# Patient Record
Sex: Male | Born: 2000 | Race: White | Hispanic: No | Marital: Single | Smoking: Current every day smoker
Health system: Southern US, Community
[De-identification: ages and names within clinical notes are randomized; demographics above are authoritative.]

## PROBLEM LIST (undated history)

## (undated) DIAGNOSIS — T7840XA Allergy, unspecified, initial encounter: Secondary | ICD-10-CM

## (undated) HISTORY — DX: Allergy, unspecified, initial encounter: T78.40XA

---
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
## 2019-03-02 ENCOUNTER — Ambulatory Visit (INDEPENDENT_AMBULATORY_CARE_PROVIDER_SITE_OTHER): Payer: BC Managed Care – PPO | Admitting: Family Medicine

## 2019-03-02 ENCOUNTER — Encounter: Payer: Self-pay | Admitting: Family Medicine

## 2019-03-02 ENCOUNTER — Other Ambulatory Visit: Payer: Self-pay

## 2019-03-02 VITALS — BP 118/80 | Temp 98.2°F | Wt 133.2 lb

## 2019-03-02 DIAGNOSIS — F439 Reaction to severe stress, unspecified: Secondary | ICD-10-CM

## 2019-03-02 DIAGNOSIS — F419 Anxiety disorder, unspecified: Secondary | ICD-10-CM

## 2019-03-02 NOTE — Progress Notes (Signed)
   Subjective:    Patient ID: Warren Short, male    DOB: July 20, 2000, 18 y.o.   MRN: [id number]  HPI Pt states he is having some issues with appetite. Pt states that sometimes he doesn't eat as much as he would like. Pt would like to eat more. Pt also has questions regarding waking up later and how that affects eating. This has been going on about a year or so. Pt did go through depression and lost a lot of weight. Pt also states that mind will sometimes race. Pt has tried counseling in the past.   Patient denies using any type of pills or other issues.  He states he is not depressed.  He does get stressed at times and occasionally angry.  He is trying to do the best he can to manage that.  He is looking for a job currently.  He did graduate high school.  He hopes to go to college next year.  He is also making music.  Working hard things.  Trying to have a healthy outlook.  He does go to bed very late at night and gets up late in the morning.  He states he does eat 3 meals a day but often eats out and does not fix meals for himself.  Patient denies any type of heartburn issues denies any health issues. Review of Systems  Constitutional: Negative for activity change.  HENT: Negative for congestion and rhinorrhea.   Respiratory: Negative for cough and shortness of breath.   Cardiovascular: Negative for chest pain.  Gastrointestinal: Negative for abdominal pain, diarrhea, nausea and vomiting.  Genitourinary: Negative for dysuria and hematuria.  Neurological: Negative for weakness and headaches.  Psychiatric/Behavioral: Negative for behavioral problems and confusion.       Objective:   Physical Exam  20 minutes spent with patient discussing multiple different issues greater than half was spent in counseling and discussion      Assessment & Plan:  Anxiety and stress-I believe the patient is adjusting.  Denies being suicidal.  States doing the best he can try to manage his stress.  Is trying to  eat somewhat on a regular basis but not eating healthy.  We did discuss ways to try to improve vegetables and fruits.  We also discussed ways to dissipate stress including meditation as well as mindfulness.  I do not feel the patient needs to be on any type of medications.  We did discuss about warning signs regarding depression and if they occur to follow-up.  At this time we have chosen to have the patient follow-up if having any ongoing troubles.  I also told him that he could always call us and I will be happy to call them back and talk with him.  No depression currently.  Patient did not want to do any type of counseling.

## 2019-07-26 ENCOUNTER — Other Ambulatory Visit: Payer: Self-pay | Admitting: *Deleted

## 2019-07-26 DIAGNOSIS — F419 Anxiety disorder, unspecified: Secondary | ICD-10-CM

## 2019-07-31 ENCOUNTER — Encounter: Payer: Self-pay | Admitting: Family Medicine

## 2019-08-17 DIAGNOSIS — Z23 Encounter for immunization: Secondary | ICD-10-CM | POA: Diagnosis not present

## 2019-09-08 DIAGNOSIS — Z23 Encounter for immunization: Secondary | ICD-10-CM | POA: Diagnosis not present

## 2020-02-14 ENCOUNTER — Ambulatory Visit (INDEPENDENT_AMBULATORY_CARE_PROVIDER_SITE_OTHER): Payer: BC Managed Care – PPO | Admitting: Family Medicine

## 2020-02-14 ENCOUNTER — Other Ambulatory Visit: Payer: Self-pay

## 2020-02-14 DIAGNOSIS — J019 Acute sinusitis, unspecified: Secondary | ICD-10-CM | POA: Diagnosis not present

## 2020-02-14 MED ORDER — AZITHROMYCIN 250 MG PO TABS: ORAL_TABLET | ORAL | 0 refills | Status: DC

## 2020-02-14 NOTE — Progress Notes (Signed)
   Marti Sleigh ctive:    Patient ID: Warren Short, male    DOB: May 15, 2000, 19 y.o.   MRN: [id number]  HPIsinus pressure yesterday and today has fever of 100.3. has not had a covid test. Has had both covid vaccines. Tried advil cold and flu.  Patient relates some low-grade fever body aches headache not feeling good sinus pressure pain discomfort.  PMH benign   Review of Systems See above    Objective:   Physical Exam  Mild sinus tenderness eardrums normal throat normal neck no masses lungs clear heart regular      Assessment & Plan:  Covid test taken Supportive care discussed Follow-up if progressive troubles or problems Warning signs discussed

## 2020-02-16 LAB — NOVEL CORONAVIRUS, NAA: SARS-CoV-2, NAA: DETECTED — AB

## 2020-02-16 LAB — SARS-COV-2, NAA 2 DAY TAT

## 2020-02-16 LAB — SPECIMEN STATUS REPORT

## 2020-02-17 ENCOUNTER — Telehealth (HOSPITAL_COMMUNITY): Payer: Self-pay | Admitting: Nurse Practitioner

## 2020-02-17 DIAGNOSIS — U071 COVID-19: Secondary | ICD-10-CM

## 2020-02-17 NOTE — Telephone Encounter (Signed)
Called to discuss with Warren Short about Covid symptoms and the use of regeneron, a monoclonal antibody infusion for those with mild to moderate Covid symptoms and at a high risk of hospitalization.     Pt is qualified for this infusion at the [facility] center due to co-morbid conditions and/or a member of an at-risk group, however declines infusion at this time. Symptoms have improved significantly and nearly resolved. Symptoms tier reviewed as well as criteria for ending isolation.  Symptoms reviewed that would warrant ED/Hospital evaluation. Preventative practices reviewed. Patient verbalized understanding. Patient advised to call back if he/she opts to proceed with infusion. Callback number provided. Urgent care and/or ER precautions given for severe symptoms. Last date eligible for infusion: 02/20/20   SVI based algorithm There are no problems to display for this patient.    Consuello Masse, NP [phone] [email or web address]

## 2020-02-27 ENCOUNTER — Telehealth: Payer: Self-pay

## 2020-02-27 NOTE — Telephone Encounter (Signed)
May do this as a office visit in person

## 2020-02-27 NOTE — Telephone Encounter (Signed)
Patient has appointment 10/21 for abdomen pain that been going on for 6 months but has had Covid on 10/6 and was up on 10/14 but mom and son live in same household and she is not off quarantine yet. Please advise on what to do with patient virtual or office for 10/21 at 11:30

## 2020-02-29 ENCOUNTER — Ambulatory Visit (INDEPENDENT_AMBULATORY_CARE_PROVIDER_SITE_OTHER): Payer: BC Managed Care – PPO | Admitting: Family Medicine

## 2020-02-29 ENCOUNTER — Other Ambulatory Visit: Payer: Self-pay

## 2020-02-29 ENCOUNTER — Encounter: Payer: Self-pay | Admitting: Family Medicine

## 2020-02-29 VITALS — HR 95 | Temp 97.5°F | Ht 73.0 in | Wt 135.0 lb

## 2020-02-29 DIAGNOSIS — R1030 Lower abdominal pain, unspecified: Secondary | ICD-10-CM

## 2020-02-29 DIAGNOSIS — S76211A Strain of adductor muscle, fascia and tendon of right thigh, initial encounter: Secondary | ICD-10-CM

## 2020-02-29 MED ORDER — MELOXICAM 15 MG PO TABS: 15.0000 mg | ORAL_TABLET | Freq: Every day | ORAL | 0 refills | Status: DC

## 2020-02-29 NOTE — Progress Notes (Signed)
   Subjective:    Patient ID: Warren Short, male    DOB: 2000/10/20, 19 y.o.   MRN: [id number]  HPIabdominal pain for a few months on right lower side. Pain is like an ache. Massage makes it better. Pain last about 5 mins.  Groin pain and discomfort on the right side.  Does not radiate into the testicles.  No dysuria urinary frequency no high fever chills or sweats Had previous hernia hernia on left side surgery at age 50  Review of Systems See above    Objective:   Physical Exam  Abdomen soft no guarding rebound or tenderness GU normal testicle exam normal bilateral no hernia detected bilateral.  No sign of any testicular problem    Assessment & Plan:  Ligament strain Has good range of motion of the hips Good strength Recommend anti-inflammatory over the next 2 weeks if ongoing trouble physical therapy if still ongoing trouble may need referral to orthopedics Follow-up here if ongoing troubles or problems no need for any x-rays lab work currently

## 2020-03-15 ENCOUNTER — Other Ambulatory Visit: Payer: Self-pay | Admitting: *Deleted

## 2020-03-15 ENCOUNTER — Telehealth: Payer: Self-pay | Admitting: Family Medicine

## 2020-03-15 MED ORDER — DOXYCYCLINE HYCLATE 100 MG PO TABS: 100.0000 mg | ORAL_TABLET | Freq: Two times a day (BID) | ORAL | 0 refills | Status: DC

## 2020-03-15 NOTE — Telephone Encounter (Signed)
Nurses I am fine with going with doxycycline 100 mg 1 pill taken twice daily with a snack and a tall glass of water for 10 days Follow-up if ongoing troubles  (Also to [place] is 19 years old.  Please make sure that his mom is on the DPR as his representative-if not you will need to speak with the patient himself thank you)

## 2020-03-15 NOTE — Telephone Encounter (Signed)
Discussed with pt. And med sent to pharm.  

## 2020-03-15 NOTE — Telephone Encounter (Signed)
Left message to return call 

## 2020-03-15 NOTE — Telephone Encounter (Signed)
Mom sent My Chart message:  Dr Lorin Picket  When [place] came in and was tested for covid you also gave him a zpack for his sinuses. It had cleared up but is coming back now. No fever but he is really stuffy and when he blows his nose its yellow and green. He also can feel it in his teeth and face. Does he need to come in again or do you think he needs another zpack?  Thank you   Please advise. Thank you

## 2020-03-26 ENCOUNTER — Ambulatory Visit: Payer: BC Managed Care – PPO | Admitting: Family Medicine

## 2020-04-15 ENCOUNTER — Telehealth: Payer: Self-pay | Admitting: *Deleted

## 2020-04-15 NOTE — Telephone Encounter (Signed)
Copied message below that front staff sent back in mother's chart on pt.   Mom refused to see anyone but Dr. Lorin Picket, said she thinks it's just his anxiety and not true stomach issues. York Spaniel he gets nauseated when he eats and this has been going on for a while now. She preferred to schedule a virtual appt for tomorrow.  Appt scheduled.

## 2020-04-16 ENCOUNTER — Other Ambulatory Visit: Payer: Self-pay

## 2020-04-16 ENCOUNTER — Ambulatory Visit (INDEPENDENT_AMBULATORY_CARE_PROVIDER_SITE_OTHER): Payer: BC Managed Care – PPO | Admitting: Family Medicine

## 2020-04-16 ENCOUNTER — Telehealth: Payer: Self-pay | Admitting: *Deleted

## 2020-04-16 DIAGNOSIS — F411 Generalized anxiety disorder: Secondary | ICD-10-CM | POA: Diagnosis not present

## 2020-04-16 DIAGNOSIS — R1013 Epigastric pain: Secondary | ICD-10-CM | POA: Diagnosis not present

## 2020-04-16 DIAGNOSIS — R110 Nausea: Secondary | ICD-10-CM

## 2020-04-16 MED ORDER — FAMOTIDINE 40 MG PO TABS: 40.0000 mg | ORAL_TABLET | Freq: Every day | ORAL | 2 refills | Status: DC

## 2020-04-16 MED ORDER — ONDANSETRON HCL 8 MG PO TABS: 8.0000 mg | ORAL_TABLET | Freq: Three times a day (TID) | ORAL | 2 refills | Status: DC | PRN

## 2020-04-16 NOTE — Telephone Encounter (Signed)
[name], [place] are scheduled for a virtual visit with your provider today.    Just as we do with appointments in the office, we must obtain your consent to participate.  Your consent will be active for this visit and any virtual visit you may have with one of our providers in the next 365 days.    If you have a MyChart account, I can also send a copy of this consent to you electronically.  All virtual visits are billed to your insurance company just like a traditional visit in the office.  As this is a virtual visit, video technology does not allow for your provider to perform a traditional examination.  This may limit your provider's ability to fully assess your condition.  If your provider identifies any concerns that need to be evaluated in person or the need to arrange testing such as labs, EKG, etc, we will make arrangements to do so.    Although advances in technology are sophisticated, we cannot ensure that it will always work on either your end or our end.  If the connection with a video visit is poor, we may have to switch to a telephone visit.  With either a video or telephone visit, we are not always able to ensure that we have a secure connection.   I need to obtain your verbal consent now.   Are you willing to proceed with your visit today?   [name] has provided verbal consent on 04/16/2020 for a virtual visit (video or telephone).   Kathleen Lime, RN 04/16/2020  2:48 PM

## 2020-04-16 NOTE — Progress Notes (Signed)
   Subjective:    Patient ID: Warren Short, male    DOB: 2000-12-27, 19 y.o.   MRN: [id number]  HPI Mild intermittent anxiousness and stress about various things but denies being depressed not interested in seeing a counselor currently.  Intermittently his stomach hurts when he gets stressed out He does admit to smoking marijuana denies abusing it occasionally gets nausea Patient calls to discuss anxiety and stress that causes his stomach to hurt after eating sometimes.   Office Visit from 02/12/2017 in [facility]  PHQ-9 Total Score 4     GAD 7 : Generalized Anxiety Score 04/16/2020 03/02/2019  Nervous, Anxious, on Edge 1 1  Control/stop worrying 1 0  Worry too much - different things 1 1  Trouble relaxing 0 0  Restless 0 0  Easily annoyed or irritable 1 3  Afraid - awful might happen 0 1  Total GAD 7 Score 4 6  Anxiety Difficulty Not difficult at all Somewhat difficult      Virtual Visit via Video Note  I connected with Warren Short on 04/16/20 at  3:30 PM EST by a video enabled telemedicine application and verified that I am speaking with the correct person using two identifiers.  Location: Patient: home Provider: office   I discussed the limitations of evaluation and management by telemedicine and the availability of in person appointments. The patient expressed understanding and agreed to proceed.  History of Present Illness:    Observations/Objective:   Assessment and Plan:   Follow Up Instructions:    I discussed the assessment and treatment plan with the patient. The patient was provided an opportunity to ask questions and all were answered. The patient agreed with the plan and demonstrated an understanding of the instructions.   The patient was advised to call back or seek an in-person evaluation if the symptoms worsen or if the condition fails to improve as anticipated.  I provided 25 including chart review documentation minutes of  non-face-to-face time during this encounter.      Review of Systems    Some intermittent epigastric nausea dyspepsia but no severe pain no emesis of blood no blood in stools Objective:   Physical Exam Today's visit was via telephone Physical exam was not possible for this visit        Assessment & Plan:  1. GAD (generalized anxiety disorder) I would recommend at this point time patient do stress relief techniques to help with this I recommended counseling if he is interested he will think about this Patient does smoke marijuana occasionally I encouraged him to avoid excessive use because this could cause hyperemesis  2. Dyspepsia Pepcid on a daily basis should help  3. Nausea Zofran as needed If ongoing troubles let us know

## 2020-10-28 ENCOUNTER — Other Ambulatory Visit: Payer: Self-pay

## 2020-10-28 ENCOUNTER — Ambulatory Visit: Payer: BC Managed Care – PPO | Admitting: Family Medicine

## 2020-10-28 ENCOUNTER — Encounter: Payer: Self-pay | Admitting: Family Medicine

## 2020-10-28 VITALS — BP 138/72 | HR 81 | Temp 98.7°F | Ht 73.0 in | Wt 140.0 lb

## 2020-10-28 DIAGNOSIS — R300 Dysuria: Secondary | ICD-10-CM | POA: Diagnosis not present

## 2020-10-28 DIAGNOSIS — N342 Other urethritis: Secondary | ICD-10-CM

## 2020-10-28 LAB — POCT URINALYSIS DIPSTICK
Spec Grav, UA: 1.01 (ref 1.010–1.025)
pH, UA: 8 (ref 5.0–8.0)

## 2020-10-28 MED ORDER — AZITHROMYCIN 250 MG PO TABS: ORAL_TABLET | ORAL | 0 refills | Status: DC

## 2020-10-28 NOTE — Progress Notes (Signed)
   Subjective:    Patient ID: Warren Short, male    DOB: 22-Aug-2000, 20 y.o.   MRN: [id number]  HPIuncomfortable feeling when having to urinate for past 8 days.  Relates slight stinging at the end of his penis is sexually active denies any discharge  Results for orders placed or performed in visit on 10/28/20  POCT urinalysis dipstick  Result Value Ref Range   Color, UA     Clarity, UA     Glucose, UA     Bilirubin, UA     Ketones, UA trace    Spec Grav, UA 1.010 1.010 - 1.025   Blood, UA     pH, UA 8.0 5.0 - 8.0   Protein, UA     Urobilinogen, UA     Nitrite, UA     Leukocytes, UA     Appearance     Odor         Review of Systems denies hematuria denies high fever chills sweats vomiting     Objective:   Physical Exam  Lungs clear heart regular pulse normal abdomen soft GU normal      Assessment & Plan:  Urethritis Check urine culture check GC chlamydia Azithromycin 250 mg tablet take 4 tablets all at 1 time follow-up if ongoing troubles warning signs discussed

## 2020-10-30 LAB — GC/CHLAMYDIA PROBE AMP
Chlamydia trachomatis, NAA: POSITIVE — AB
Neisseria Gonorrhoeae by PCR: NEGATIVE

## 2020-10-31 LAB — URINE CULTURE: Organism ID, Bacteria: NO GROWTH

## 2021-02-25 DIAGNOSIS — J101 Influenza due to other identified influenza virus with other respiratory manifestations: Secondary | ICD-10-CM | POA: Diagnosis not present

## 2021-07-30 ENCOUNTER — Ambulatory Visit: Payer: BC Managed Care – PPO | Admitting: Family Medicine

## 2021-07-30 ENCOUNTER — Other Ambulatory Visit: Payer: Self-pay

## 2021-07-30 VITALS — BP 128/80 | HR 80 | Temp 98.2°F | Ht 73.0 in | Wt 140.0 lb

## 2021-07-30 DIAGNOSIS — N63 Unspecified lump in unspecified breast: Secondary | ICD-10-CM

## 2021-07-30 NOTE — Progress Notes (Signed)
? ?  Subjective:  ? ? Patient ID: Warren Short, male    DOB: April 09, 2001, 21 y.o.   MRN: [id number] ? ?HPI ?Lump on Left side chest under left nipple x 1  month , soreness   ? ?Nodule left breast ?Denies any chest tightness pressure pain does relate a little bit of soreness ?Review of Systems ? ?   ?Objective:  ? Physical Exam ?Lungs clear heart regular ?Nodule noted underneath the left nipple ?Right nipple no trouble ? ? ? ? ?   ?Assessment & Plan:  ?Breast nodule ?Mammogram recommended ?Lab work ordered ?May need consultation with breast surgeon depending on the results of the test ?More than likely benign ?Still needs thorough follow-through patient understands ? ?

## 2021-08-05 ENCOUNTER — Other Ambulatory Visit: Payer: Self-pay

## 2021-08-05 ENCOUNTER — Ambulatory Visit (HOSPITAL_COMMUNITY)
Admission: RE | Admit: 2021-08-05 | Discharge: 2021-08-05 | Disposition: A | Discharge status: Home / Self Care | Payer: BC Managed Care – PPO | Source: Ambulatory Visit | Attending: Family Medicine | Admitting: Family Medicine

## 2021-08-05 DIAGNOSIS — N63 Unspecified lump in unspecified breast: Secondary | ICD-10-CM | POA: Diagnosis not present

## 2021-08-05 DIAGNOSIS — N644 Mastodynia: Secondary | ICD-10-CM | POA: Diagnosis not present

## 2021-08-05 DIAGNOSIS — N62 Hypertrophy of breast: Secondary | ICD-10-CM | POA: Diagnosis not present

## 2021-08-28 ENCOUNTER — Other Ambulatory Visit: Payer: Self-pay | Admitting: Family Medicine

## 2021-08-28 DIAGNOSIS — N63 Unspecified lump in unspecified breast: Secondary | ICD-10-CM

## 2021-09-16 ENCOUNTER — Ambulatory Visit: Payer: BC Managed Care – PPO | Admitting: General Surgery

## 2021-09-30 ENCOUNTER — Ambulatory Visit: Payer: BC Managed Care – PPO | Admitting: General Surgery

## 2021-10-09 ENCOUNTER — Encounter: Payer: Self-pay | Admitting: General Surgery

## 2021-10-09 ENCOUNTER — Ambulatory Visit (INDEPENDENT_AMBULATORY_CARE_PROVIDER_SITE_OTHER): Payer: BC Managed Care – PPO | Admitting: General Surgery

## 2021-10-09 VITALS — BP 116/72 | HR 62 | Temp 97.6°F | Resp 16 | Ht 73.0 in | Wt 141.0 lb

## 2021-10-09 DIAGNOSIS — N62 Hypertrophy of breast: Secondary | ICD-10-CM

## 2021-10-09 NOTE — Progress Notes (Signed)
[facility] History and Physical  Reason for Referral:*** Referring Physician: ***  Chief Complaint   New Patient (Initial Visit)     Warren Short is a 21 y.o. male.  HPI: ***.  The *** started *** and has had a duration of ***.  It is associated with ***.  The *** is improved with ***, and is made worse with ***.    Quality*** Context***  Past Medical History:  Diagnosis Date  . Allergy     Past Surgical History:  Procedure Laterality Date  . INGUINAL HERNIA REPAIR  02/2005    No family history on file.  Social History   Tobacco Use  . Smoking status: Every Day    Types: E-cigarettes  . Smokeless tobacco: Never  Vaping Use  . Vaping Use: Every day  Substance Use Topics  . Alcohol use: No  . Drug use: No    Medications: {medication reviewed/display:3041432} Allergies as of 10/09/2021       Reactions   Amoxil [amoxicillin] Hives   Cefzil [cefprozil] Hives   Penicillins Hives   rash   Shellfish Allergy Hives        Medication List    as of October 09, 2021  2:49 PM   You have not been prescribed any medications.      ROS:  {Review of Systems:30496}  Blood pressure 116/72, pulse 62, temperature 97.6 F (36.4 C), temperature source Other (Comment), resp. rate 16, height 6\' 1"  (1.854 m), weight 141 lb (64 kg), SpO2 98 %. Physical Exam  Results:CLINICAL DATA:  21 year old male with a left retroareolar palpable area of concern with associated tenderness.   EXAM: ULTRASOUND OF THE LEFT BREAST   COMPARISON:  None.   FINDINGS: Physical examination of the retroareolar left breast reveals a 2-3 cm rubbery area of mobile soft tissue.   Targeted ultrasound of the retroareolar left breast was performed demonstrating a focal area of glandular tissue spanning approximately 2 cm. Findings are most compatible with benign gynecomastia. No suspicious masses or any other abnormalities identified in the left breast sonographically.    IMPRESSION: Gynecomastia, accounting for the left retroareolar palpable area of concern.   RECOMMENDATION: Clinical follow-up.   I have discussed the findings and recommendations with the patient. If applicable, a reminder letter will be sent to the patient regarding the next appointment.   BI-RADS CATEGORY  2: Benign.     Electronically Signed   By: 21 M.D.   On: 08/05/2021 16:04  No results found for this or any previous visit (from the past 48 hour(s)).  No results found.   Assessment & Plan:  Warren Short is a 21 y.o. male with *** -*** -*** -Follow up ***  All questions were answered to the satisfaction of the patient and family***.  The risk and benefits of *** were discussed including but not limited to ***.  After careful consideration, Warren Short has decided to ***.    Warren Short 10/09/2021, 2:49 PM

## 2021-10-09 NOTE — Patient Instructions (Signed)
Call if decide you want to do surgery. Call if questions or concerns.

## 2021-11-19 ENCOUNTER — Ambulatory Visit: Payer: Self-pay | Admitting: Family Medicine

## 2022-03-02 DIAGNOSIS — J029 Acute pharyngitis, unspecified: Secondary | ICD-10-CM | POA: Diagnosis not present

## 2022-03-02 DIAGNOSIS — J309 Allergic rhinitis, unspecified: Secondary | ICD-10-CM | POA: Diagnosis not present

## 2022-11-05 IMAGING — US US BREAST*L* LIMITED INC AXILLA
1 series · 6 of 6 positions shown · non-contrast
Comparison: None.

CLINICAL DATA: 21-year-old male with a left retroareolar palpable
area of concern with associated tenderness.

EXAM:
ULTRASOUND OF THE LEFT BREAST

[Series 1: us breast*left* limited inc axilla · 0.07mm/px · 6 of 6 slices shown]
[im 1/6]
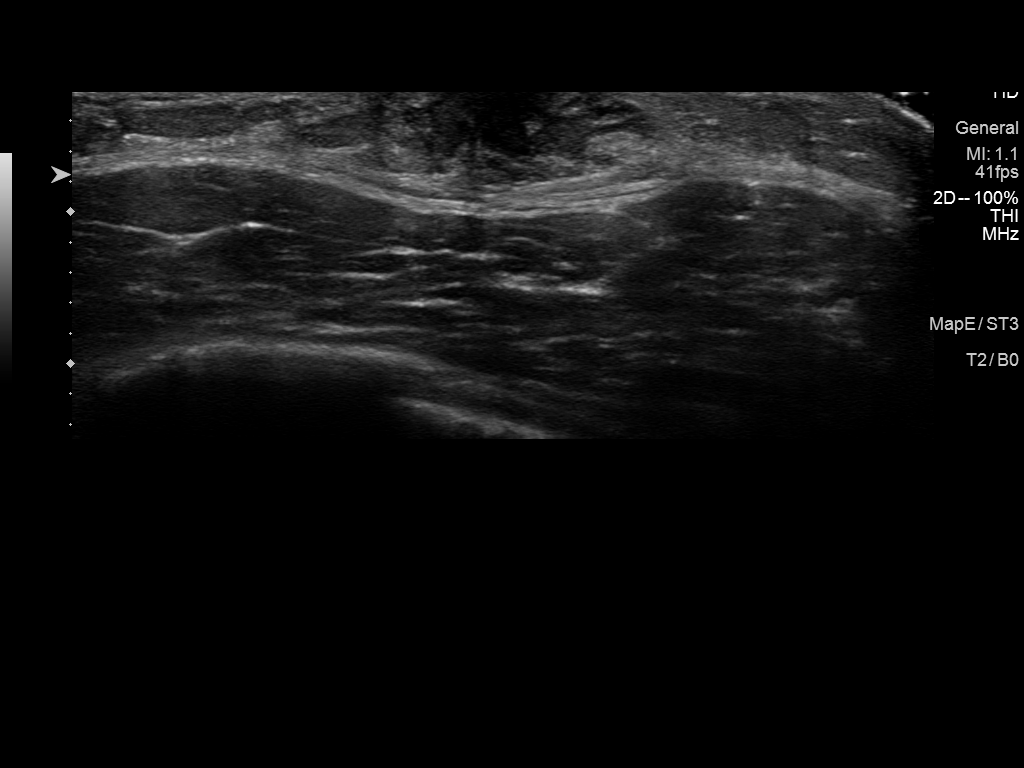
[im 2/6]
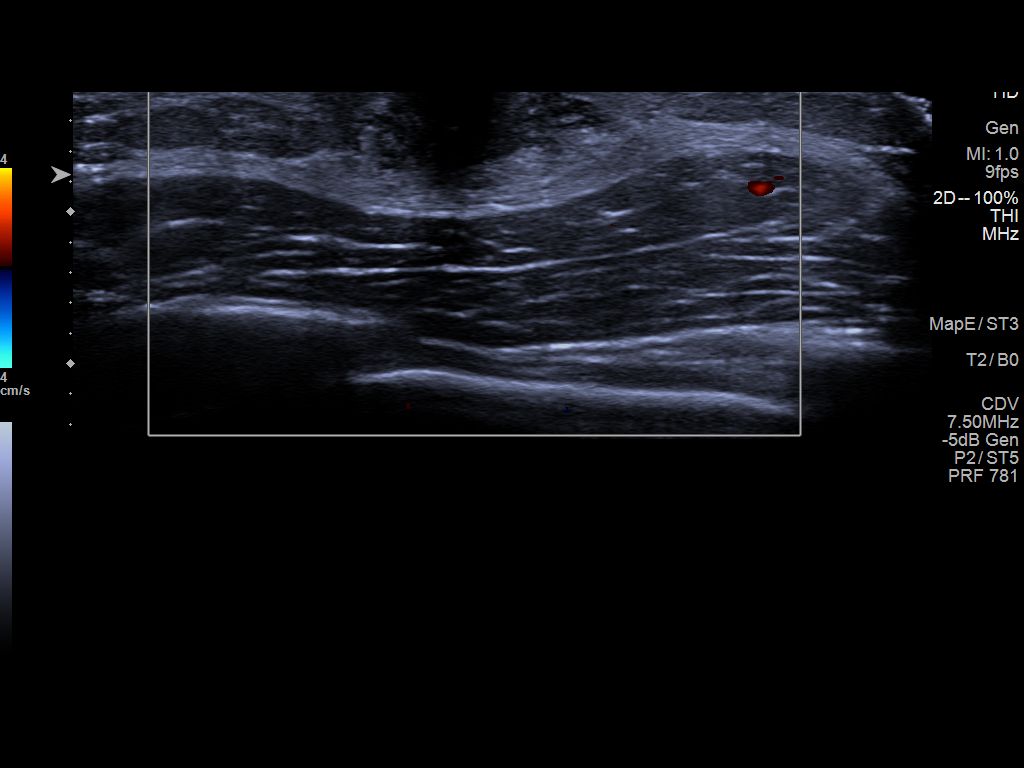
[im 3/6]
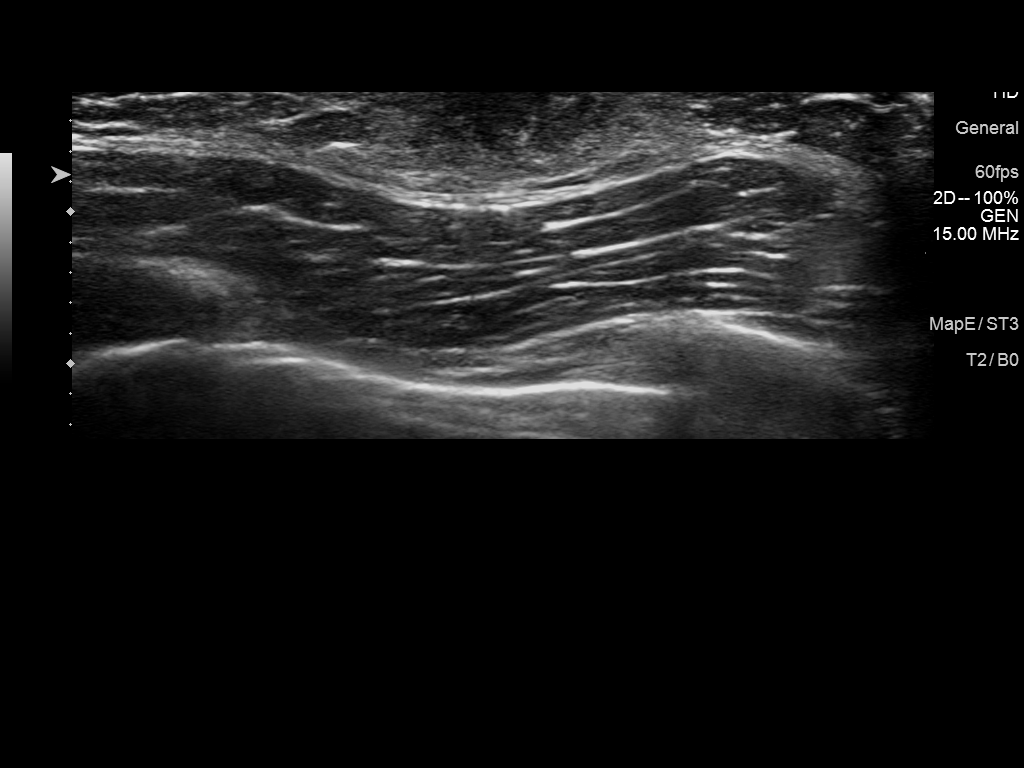
[im 4/6]
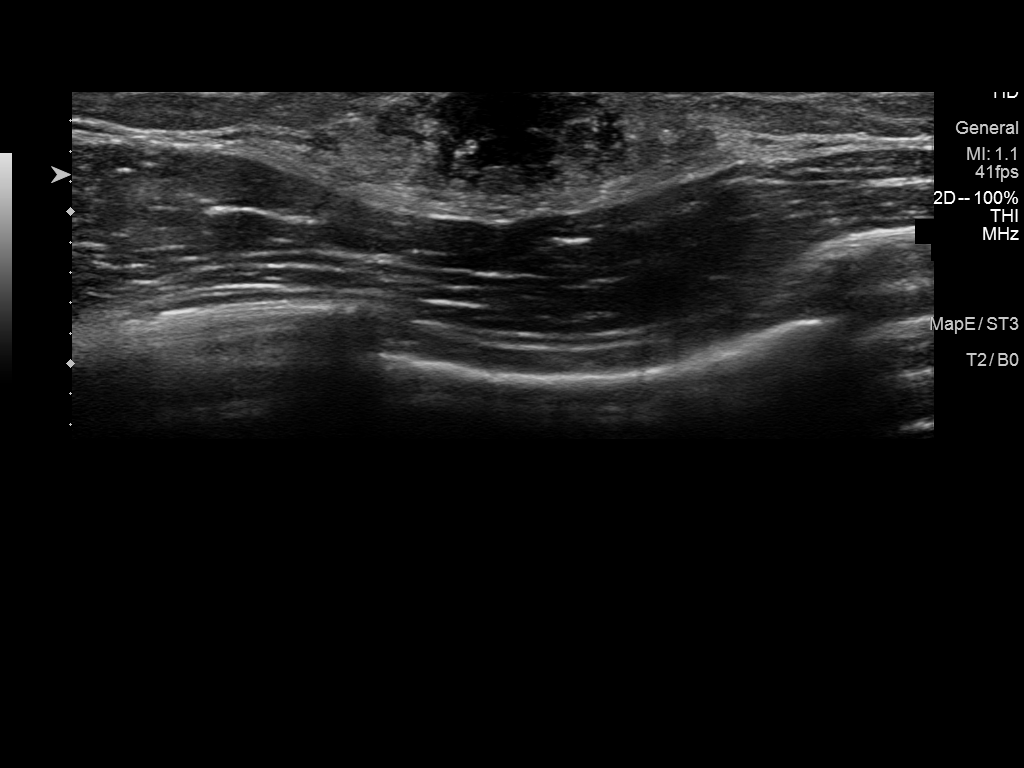
[im 5/6]
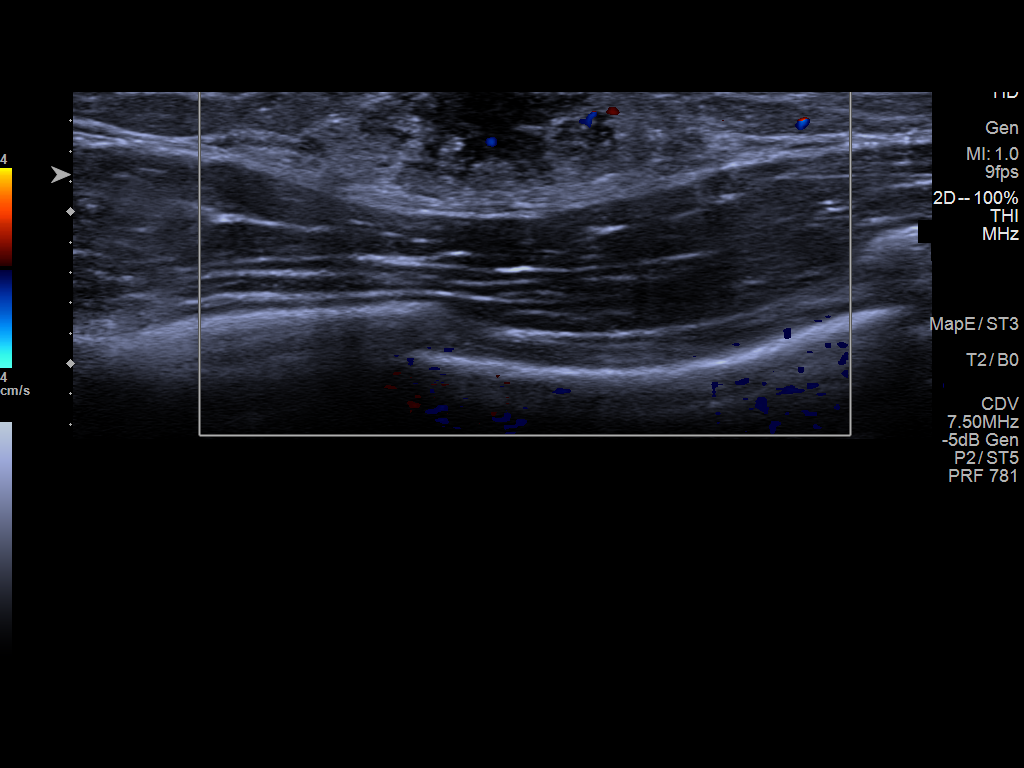
[im 6/6]
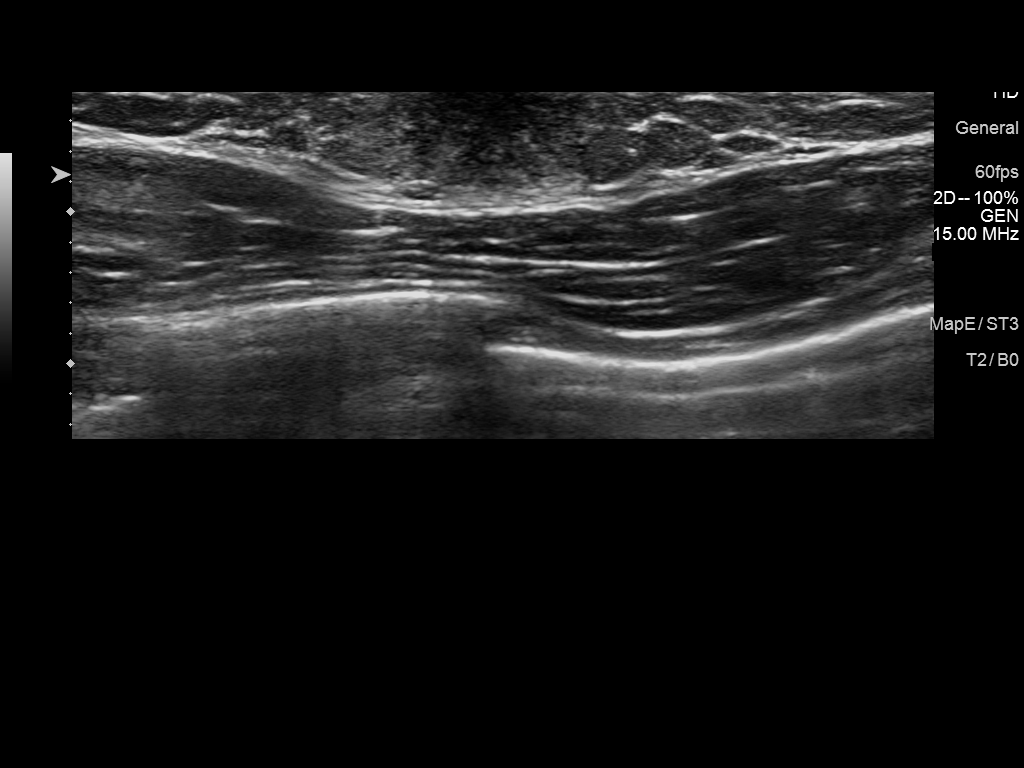

[6 of 6 positions shown; findings below may reference images not displayed]

FINDINGS: Physical examination of the retroareolar left breast reveals a 2-3
cm rubbery area of mobile soft tissue.

Targeted ultrasound of the retroareolar left breast was performed
demonstrating a focal area of glandular tissue spanning
approximately 2 cm. Findings are most compatible with benign
gynecomastia. No suspicious masses or any other abnormalities
identified in the left breast sonographically.
IMPRESSION: Gynecomastia, accounting for the left retroareolar palpable area of
concern.

RECOMMENDATION:
Clinical follow-up.

I have discussed the findings and recommendations with the patient.
If applicable, a reminder letter will be sent to the patient
regarding the next appointment.

BI-RADS CATEGORY  2: Benign.

## 2023-05-19 ENCOUNTER — Ambulatory Visit (INDEPENDENT_AMBULATORY_CARE_PROVIDER_SITE_OTHER): Payer: Self-pay | Admitting: Family Medicine

## 2023-05-19 VITALS — BP 130/88 | HR 90 | Temp 98.7°F | Ht 73.0 in | Wt 152.0 lb

## 2023-05-19 DIAGNOSIS — L301 Dyshidrosis [pompholyx]: Secondary | ICD-10-CM | POA: Diagnosis not present

## 2023-05-19 MED ORDER — CLOBETASOL PROPIONATE 0.05 % EX CREA: TOPICAL_CREAM | CUTANEOUS | 1 refills | Status: AC

## 2023-05-19 NOTE — Progress Notes (Signed)
   Subjective:    Patient ID: Warren Short, male    DOB: [birth year], 23 y.o.   MRN: [id number]  HPI Patient with a rash that comes in his left hand more at the base of the fifth metacarpal also some of the fingers where it cracks denies any previous trouble with this is been going on the past month We did discuss lotion and on a regular basis during the wintertime May use humidifier as well    Review of Systems     Objective:   Physical Exam The rash has appearance of dyshidrotic eczema although it is not broken open currently       Assessment & Plan:  Problem will dyshidrotic eczema Recommend steroid twice daily when it flares up back off when it is doing better Do not see any signs of tinea If progressive troubles or worse referral to dermatology next step

## 2024-01-18 ENCOUNTER — Encounter (HOSPITAL_COMMUNITY): Payer: Self-pay

## 2024-01-18 ENCOUNTER — Emergency Department (HOSPITAL_COMMUNITY)

## 2024-01-18 ENCOUNTER — Emergency Department (HOSPITAL_COMMUNITY)
Admission: EM | Admit: 2024-01-18 | Discharge: 2024-01-18 | Disposition: A | Discharge status: Home / Self Care | Attending: Emergency Medicine | Admitting: Emergency Medicine

## 2024-01-18 ENCOUNTER — Other Ambulatory Visit: Payer: Self-pay

## 2024-01-18 DIAGNOSIS — Z23 Encounter for immunization: Secondary | ICD-10-CM | POA: Insufficient documentation

## 2024-01-18 DIAGNOSIS — Y9302 Activity, running: Secondary | ICD-10-CM | POA: Insufficient documentation

## 2024-01-18 DIAGNOSIS — Y92410 Unspecified street and highway as the place of occurrence of the external cause: Secondary | ICD-10-CM | POA: Insufficient documentation

## 2024-01-18 DIAGNOSIS — W010XXA Fall on same level from slipping, tripping and stumbling without subsequent striking against object, initial encounter: Secondary | ICD-10-CM | POA: Insufficient documentation

## 2024-01-18 DIAGNOSIS — S6991XA Unspecified injury of right wrist, hand and finger(s), initial encounter: Secondary | ICD-10-CM | POA: Diagnosis present

## 2024-01-18 DIAGNOSIS — S62101A Fracture of unspecified carpal bone, right wrist, initial encounter for closed fracture: Secondary | ICD-10-CM

## 2024-01-18 DIAGNOSIS — S52501A Unspecified fracture of the lower end of right radius, initial encounter for closed fracture: Secondary | ICD-10-CM | POA: Insufficient documentation

## 2024-01-18 LAB — CBG MONITORING, ED: Glucose-Capillary: 109 mg/dL — ABNORMAL HIGH (ref 70–99)

## 2024-01-18 MED ORDER — TETANUS-DIPHTH-ACELL PERTUSSIS 5-2.5-18.5 LF-MCG/0.5 IM SUSY
0.5000 mL | PREFILLED_SYRINGE | Freq: Once | INTRAMUSCULAR | Status: AC
  Administered 2024-01-18: 0.5 mL via INTRAMUSCULAR
  Filled 2024-01-18: qty 0.5

## 2024-01-18 MED ORDER — ACETAMINOPHEN 500 MG PO TABS: 500.0000 mg | ORAL_TABLET | Freq: Four times a day (QID) | ORAL | 0 refills | Status: AC | PRN

## 2024-01-18 MED ORDER — BACITRACIN ZINC 500 UNIT/GM EX OINT
TOPICAL_OINTMENT | CUTANEOUS | Status: AC
  Administered 2024-01-18: 1
  Filled 2024-01-18: qty 0.9

## 2024-01-18 MED ORDER — ACETAMINOPHEN ER 650 MG PO TBCR: 650.0000 mg | EXTENDED_RELEASE_TABLET | Freq: Three times a day (TID) | ORAL | 0 refills | Status: DC | PRN

## 2024-01-18 NOTE — ED Triage Notes (Signed)
 Pt arrived via [facility] c/o right wrist injury that occurred when the Pts slipped and fell forward onto concrete.

## 2024-01-18 NOTE — Discharge Instructions (Addendum)
 You were seen in the emergency room after you had a fall.  X-ray confirms fracture around the wrist.  Please call the orthopedic doctor at the number provided to set up an appointment in 10 to 14 days.  Take Tylenol  5 mg every 4-6 hours for pain control.
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
## 2024-01-18 NOTE — ED Notes (Signed)
 ED Provider at bedside.

## 2024-02-02 ENCOUNTER — Other Ambulatory Visit (INDEPENDENT_AMBULATORY_CARE_PROVIDER_SITE_OTHER): Payer: Self-pay

## 2024-02-02 ENCOUNTER — Ambulatory Visit: Admitting: Orthopedic Surgery

## 2024-02-02 ENCOUNTER — Encounter: Payer: Self-pay | Admitting: Orthopedic Surgery

## 2024-02-02 VITALS — BP 137/94 | Ht 73.0 in | Wt 152.0 lb

## 2024-02-02 DIAGNOSIS — S52571A Other intraarticular fracture of lower end of right radius, initial encounter for closed fracture: Secondary | ICD-10-CM | POA: Diagnosis not present

## 2024-02-02 DIAGNOSIS — M25531 Pain in right wrist: Secondary | ICD-10-CM

## 2024-02-02 NOTE — Progress Notes (Signed)
  Intake history:  Chief Complaint  Patient presents with   Wrist Injury    Right 01/18/24     BP (!) 137/94 Comment: 01/18/24  Ht 6' 1 (1.854 m)   Wt 152 lb (68.9 kg)   BMI 20.05 kg/m  Body mass index is 20.05 kg/m.  Pharmacy? ______________________________________  WHAT ARE WE SEEING YOU FOR TODAY?   Right wrist   How long has this bothered you? (DOI?DOS?WS?)  on 01/18/24  Was there an injury? Yes  Anticoag.  No  Diabetes No  Heart disease No  Hypertension No  SMOKING HX Yes  Kidney disease No  Any ALLERGIES ___________ Allergies  Allergen Reactions   Amoxil [Amoxicillin] Hives   Cefzil [Cefprozil] Hives   Penicillins Hives    rash   Shellfish Allergy Hives   ___________________________________   Treatment:  Have you taken:  Tylenol  Yes  Advil No  Had PT No  Had injection No  Other  ______________ER splint ___________

## 2024-02-02 NOTE — Progress Notes (Signed)
   Chief Complaint  Patient presents with   Wrist Injury    Right 01/18/24   HPI  23 year old male fell running fell on outstretched hand complains of pain over the right wrist Abrasion over the palm No elbow pain no shoulder pain no forearm pain No paresthesias  Past Medical History:  Diagnosis Date   Allergy    Past Surgical History:  Procedure Laterality Date   INGUINAL HERNIA REPAIR  02/2005   BP (!) 137/94 Comment: 01/18/24  Ht 6' 1 (1.854 m)   Wt 152 lb (68.9 kg)   BMI 20.05 kg/m   He is awake alert and oriented x 3  Slim build normal appearance no gross abnormalities mood affect normal gait and station normal  Again on the palm of his right hand he has an abrasion it looks like it is healing he is tender over the wrist joint shoulder elbow forearm upper arm nontender he actually has a fairly good range of motion I did not press the stability testing the muscle tone was normal there was no atrophy pulse and perfusion normal no edema no lymph nodes normal sensation  Interpretation of outside imaging Initial x-ray at time of the fall was on September 9 shows an intra-articular fracture of the distal radius primarily involving the radial half of the wrist it is nondisplaced  Repeat imaging today  DG Wrist Complete Right Result Date: 02/02/2024 Wrist x-ray right follow-up right wrist fracture 3 views of the right wrist Compared to x-rays taken 2 weeks ago Intra-articular fractures are resolving malalignment Healing distal radius fracture which has intra-articular components     Diagnosis and plan  Encounter Diagnoses  Name Primary?   Pain in right wrist Yes   Other closed intra-articular fracture of distal end of right radius, initial encounter      Band-Aid for the wound  Forearm wrist removable brace x 4 weeks  Repeat x-ray 4 weeks

## 2024-02-03 ENCOUNTER — Ambulatory Visit: Admitting: Orthopedic Surgery

## 2024-03-02 ENCOUNTER — Other Ambulatory Visit: Payer: Self-pay

## 2024-03-02 ENCOUNTER — Ambulatory Visit (INDEPENDENT_AMBULATORY_CARE_PROVIDER_SITE_OTHER): Admitting: Orthopedic Surgery

## 2024-03-02 ENCOUNTER — Encounter: Payer: Self-pay | Admitting: Orthopedic Surgery

## 2024-03-02 DIAGNOSIS — S52571D Other intraarticular fracture of lower end of right radius, subsequent encounter for closed fracture with routine healing: Secondary | ICD-10-CM

## 2024-03-02 NOTE — Progress Notes (Signed)
    03/02/2024   Chief Complaint  Patient presents with   Wrist Injury    Right     Encounter Diagnosis  Name Primary?   Other closed intra-articular fracture of distal end of right radius with routine healing, subsequent encounter 01/18/24 Yes    What pharmacy do you use ? _______WG Scales____________________  DOI/DOS/ Date: 01/18/24  Improved still has some soreness occasionally

## 2024-03-02 NOTE — Progress Notes (Signed)
 Fracture care follow-up   03/02/2024   Chief Complaint  Patient presents with   Wrist Injury    Right     Encounter Diagnosis  Name Primary?   Other closed intra-articular fracture of distal end of right radius with routine healing, subsequent encounter 01/18/24 Yes    What pharmacy do you use ? _______WG Scales____________________  DOI/DOS/ Date: 01/18/24  Improved still has some soreness occasionally   DG Wrist Complete Right Result Date: 03/02/2024 Wrist x-ray follow-up on the fracture distal radius X-ray shows complete fracture consolidation without any malalignment Impression healed right distal radius fracture    Clinical exam is normal images show fracture healing  Release patient follow-up as needed
